# Patient Record
Sex: Male | Born: 1950 | Race: White | Hispanic: No | Marital: Married | Smoking: Former smoker
Health system: Southern US, Community
[De-identification: ages and names within clinical notes are randomized; demographics above are authoritative.]

## PROBLEM LIST (undated history)

## (undated) DIAGNOSIS — E119 Type 2 diabetes mellitus without complications: Secondary | ICD-10-CM

---

## 2006-03-18 ENCOUNTER — Ambulatory Visit: Payer: Self-pay | Admitting: General Surgery

## 2006-09-30 ENCOUNTER — Encounter: Payer: Self-pay | Admitting: Specialist

## 2006-10-06 ENCOUNTER — Ambulatory Visit: Payer: Self-pay | Admitting: Specialist

## 2007-06-11 ENCOUNTER — Ambulatory Visit: Payer: Self-pay | Admitting: Unknown Physician Specialty

## 2007-06-24 ENCOUNTER — Ambulatory Visit: Payer: Self-pay | Admitting: Unknown Physician Specialty

## 2021-05-11 ENCOUNTER — Emergency Department
Admission: EM | Admit: 2021-05-11 | Discharge: 2021-05-11 | Disposition: A | Discharge status: Home / Self Care | Payer: Medicare HMO | Attending: Emergency Medicine | Admitting: Emergency Medicine

## 2021-05-11 ENCOUNTER — Encounter: Payer: Self-pay | Admitting: *Deleted

## 2021-05-11 ENCOUNTER — Other Ambulatory Visit: Payer: Self-pay

## 2021-05-11 ENCOUNTER — Emergency Department: Payer: Medicare HMO

## 2021-05-11 DIAGNOSIS — M25512 Pain in left shoulder: Secondary | ICD-10-CM | POA: Diagnosis not present

## 2021-05-11 DIAGNOSIS — S0081XA Abrasion of other part of head, initial encounter: Secondary | ICD-10-CM | POA: Insufficient documentation

## 2021-05-11 DIAGNOSIS — S4992XA Unspecified injury of left shoulder and upper arm, initial encounter: Secondary | ICD-10-CM | POA: Diagnosis present

## 2021-05-11 DIAGNOSIS — E119 Type 2 diabetes mellitus without complications: Secondary | ICD-10-CM | POA: Diagnosis not present

## 2021-05-11 DIAGNOSIS — Z87891 Personal history of nicotine dependence: Secondary | ICD-10-CM | POA: Insufficient documentation

## 2021-05-11 DIAGNOSIS — Y92410 Unspecified street and highway as the place of occurrence of the external cause: Secondary | ICD-10-CM | POA: Diagnosis not present

## 2021-05-11 DIAGNOSIS — S40812A Abrasion of left upper arm, initial encounter: Secondary | ICD-10-CM | POA: Insufficient documentation

## 2021-05-11 HISTORY — DX: Type 2 diabetes mellitus without complications: E11.9

## 2021-05-11 MED ORDER — OXYCODONE-ACETAMINOPHEN 5-325 MG PO TABS
1.0000 | ORAL_TABLET | Freq: Once | ORAL | Status: AC
  Administered 2021-05-11: 1 via ORAL
  Filled 2021-05-11: qty 1

## 2021-05-11 MED ORDER — CEPHALEXIN 500 MG PO CAPS: 500.0000 mg | ORAL_CAPSULE | Freq: Four times a day (QID) | ORAL | 0 refills | Status: DC

## 2021-05-11 MED ORDER — OXYCODONE-ACETAMINOPHEN 5-325 MG PO TABS: 1.0000 | ORAL_TABLET | Freq: Four times a day (QID) | ORAL | 0 refills | Status: AC | PRN
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
## 2021-05-11 NOTE — Discharge Instructions (Addendum)
Take Keflex four times daily for the next seven days.  Take Percocet as needed for pain.
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
## 2021-05-12 ENCOUNTER — Telehealth: Payer: Self-pay | Admitting: Emergency Medicine

## 2021-05-12 MED ORDER — CEPHALEXIN 500 MG PO CAPS: 500.0000 mg | ORAL_CAPSULE | Freq: Four times a day (QID) | ORAL | 0 refills | Status: AC

## 2021-05-12 NOTE — Telephone Encounter (Cosign Needed)
Patient unable to pick up Keflex at prior pharmacy do to holiday. Changed pharmacy to [facility] in [place]. Patient also concerned about dressing changes for LUE wounds. Patient education was given regarding how to do dressing changes at home. Told patient he was welcome to return to ED for dressing changes if needed.

## 2021-05-15 ENCOUNTER — Encounter: Payer: Medicare HMO | Attending: Internal Medicine | Admitting: Internal Medicine

## 2021-05-15 ENCOUNTER — Other Ambulatory Visit: Payer: Self-pay

## 2021-05-15 DIAGNOSIS — E1151 Type 2 diabetes mellitus with diabetic peripheral angiopathy without gangrene: Secondary | ICD-10-CM | POA: Insufficient documentation

## 2021-05-15 DIAGNOSIS — S40812A Abrasion of left upper arm, initial encounter: Secondary | ICD-10-CM | POA: Diagnosis not present

## 2021-05-15 DIAGNOSIS — E11622 Type 2 diabetes mellitus with other skin ulcer: Secondary | ICD-10-CM | POA: Diagnosis not present
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
## 2021-05-16 NOTE — Progress Notes (Signed)
MEHAR, KIRKWOOD ([id number]) Visit Report for 05/15/2021 Abuse/Suicide Risk Screen Details Patient Name: Gregory Nielsen Date of Service: 05/15/2021 8:30 AM Medical Record Number: [id number] Patient Account Number: [id number] Date of Birth/Sex: 08/10/1951 (69 y.o. M) Treating RN: Yevonne Pax Primary Care Jameon Deller: Lenon Oms Other Clinician: Referring Karry Barrilleaux: Pia Mau Treating Janila Arrazola/Extender: Altamese [place] in Treatment: 0 Abuse/Suicide Risk Screen Items Answer ABUSE RISK SCREEN: Has anyone close to you tried to hurt or harm you recentlyo No Do you feel uncomfortable with anyone in your familyo No Has anyone forced you do things that you didnot want to doo No Electronic Signature(s) Signed: 05/16/2021 4:50:12 PM By: Yevonne Pax RN Entered By: Yevonne Pax on 05/15/2021 08:55:49 Gregory Nielsen ([id number]) -------------------------------------------------------------------------------- Activities of Daily Living Details Patient Name: Gregory Nielsen Date of Service: 05/15/2021 8:30 AM Medical Record Number: [id number] Patient Account Number: [id number] Date of Birth/Sex: 08/18/1951 (69 y.o. M) Treating RN: Yevonne Pax Primary Care Dakoda Laventure: Lenon Oms Other Clinician: Referring Lillionna Nabi: Pia Mau Treating Birtie Fellman/Extender: Altamese [place] in Treatment: 0 Activities of Daily Living Items Answer Activities of Daily Living (Please select one for each item) Drive Automobile Completely Able Take Medications Completely Able Use Telephone Completely Able Care for Appearance Completely Able Use Toilet Completely Able Bath / Shower Completely Able Dress Self Completely Able Feed Self Completely Able Walk Completely Able Get In / Out Bed Completely Able Housework Completely Able Prepare Meals Completely Able Handle Money Completely Able Shop for Self Completely Able Electronic Signature(s) Signed: 05/16/2021 4:50:12 PM By: Yevonne Pax RN Entered By: Yevonne Pax on 05/15/2021 08:56:35 Gregory Nielsen ([id number]) -------------------------------------------------------------------------------- Education Screening Details Patient Name: Gregory Nielsen Date of Service: 05/15/2021 8:30 AM Medical Record Number: [id number] Patient Account Number: [id number] Date of Birth/Sex: Apr 01, 1951 (69 y.o. M) Treating RN: Yevonne Pax Primary Care Hayden Mabin: Lenon Oms Other Clinician: Referring Altovise Wahler: Pia Mau Treating Nyemah Watton/Extender: Altamese [place] in Treatment: 0 Primary Learner Assessed: Patient Learning Preferences/Education Level/Primary Language Learning Preference: Explanation Highest Education Level: College or Above Preferred Language: English Cognitive Barrier Language Barrier: No Translator Needed: No Memory Deficit: No Emotional Barrier: No Cultural/Religious Beliefs Affecting Medical Care: No Physical Barrier Impaired Vision: No Impaired Hearing: No Decreased Hand dexterity: No Knowledge/Comprehension Knowledge Level: Medium Comprehension Level: High Ability to understand written instructions: High Ability to understand verbal instructions: High Motivation Anxiety Level: Anxious Cooperation: Cooperative Education Importance: Acknowledges Need Interest in Health Problems: Asks Questions Perception: Coherent Willingness to Engage in Self-Management High Activities: Readiness to Engage in Self-Management High Activities: Electronic Signature(s) Signed: 05/16/2021 4:50:12 PM By: Yevonne Pax RN Entered By: Yevonne Pax on 05/15/2021 08:57:15 Gregory Nielsen ([id number]) -------------------------------------------------------------------------------- Fall Risk Assessment Details Patient Name: Gregory Nielsen Date of Service: 05/15/2021 8:30 AM Medical Record Number: [id number] Patient Account Number: [id number] Date of Birth/Sex: 01-04-1951 (69 y.o. M) Treating RN: Yevonne Pax Primary Care Maris Bena: Lenon Oms Other  Clinician: Referring Tabatha Razzano: Pia Mau Treating Special Ranes/Extender: Altamese [place] in Treatment: 0 Fall Risk Assessment Items Have you had 2 or more falls in the last 12 monthso 0 No Have you had any fall that resulted in injury in the last 12 monthso 0 No FALLS RISK SCREEN History of falling - immediate or within 3 months 0 No Secondary diagnosis (Do you have 2 or more medical diagnoseso) 0 No Ambulatory aid None/bed rest/wheelchair/nurse 0 No Crutches/cane/walker 0 No Furniture 0 No Intravenous therapy Access/Saline/Heparin Lock 0 No Gait/Transferring Normal/ bed rest/ wheelchair 0 No Weak (short steps with or without shuffle, stooped  but able to lift head while walking, may 0 No seek support from furniture) Impaired (short steps with shuffle, may have difficulty arising from chair, head down, impaired 0 No balance) Mental Status Oriented to own ability 0 No Electronic Signature(s) Signed: 05/16/2021 4:50:12 PM By: Yevonne Pax RN Entered By: Yevonne Pax on 05/15/2021 08:57:23 Gregory Nielsen ([id number]) -------------------------------------------------------------------------------- Foot Assessment Details Patient Name: Gregory Nielsen Date of Service: 05/15/2021 8:30 AM Medical Record Number: [id number] Patient Account Number: [id number] Date of Birth/Sex: 1951-04-13 (69 y.o. M) Treating RN: Yevonne Pax Primary Care Mumin Denomme: Lenon Oms Other Clinician: Referring Daeveon Zweber: Pia Mau Treating Danyael Alipio/Extender: Altamese [place] in Treatment: 0 Foot Assessment Items Site Locations + = Sensation present, - = Sensation absent, C = Callus, U = Ulcer R = Redness, W = Warmth, M = Maceration, PU = Pre-ulcerative lesion F = Fissure, S = Swelling, D = Dryness Assessment Right: Left: Other Deformity: No No Prior Foot Ulcer: No No Prior Amputation: No No Charcot Joint: No No Ambulatory Status: Ambulatory Without Help Gait: Unsteady Electronic  Signature(s) Signed: 05/16/2021 4:50:12 PM By: Yevonne Pax RN Entered By: Yevonne Pax on 05/15/2021 08:57:47 Gregory Nielsen ([id number]) -------------------------------------------------------------------------------- Nutrition Risk Screening Details Patient Name: Gregory Nielsen Date of Service: 05/15/2021 8:30 AM Medical Record Number: [id number] Patient Account Number: [id number] Date of Birth/Sex: [birth year] (69 y.o. M) Treating RN: Yevonne Pax Primary Care Marlise Fahr: Lenon Oms Other Clinician: Referring Tristy Udovich: Pia Mau Treating Jeweldean Drohan/Extender: Altamese [place] in Treatment: 0 Height (in): 75 Weight (lbs): 250 Body Mass Index (BMI): 31.2 Nutrition Risk Screening Items Score Screening NUTRITION RISK SCREEN: I have an illness or condition that made me change the kind and/or amount of food I eat 0 No I eat fewer than two meals per day 0 No I eat few fruits and vegetables, or milk products 0 No I have three or more drinks of beer, liquor or wine almost every day 0 No I have tooth or mouth problems that make it hard for me to eat 0 No I don't always have enough money to buy the food I need 0 No I eat alone most of the time 0 No I take three or more different prescribed or over-the-counter drugs a day 0 No Without wanting to, I have lost or gained 10 pounds in the last six months 0 No I am not always physically able to shop, cook and/or feed myself 0 No Nutrition Protocols Good Risk Protocol 0 No interventions needed Moderate Risk Protocol High Risk Proctocol Risk Level: Good Risk Score: 0 Electronic Signature(s) Signed: 05/16/2021 4:50:12 PM By: Yevonne Pax RN Entered By: Yevonne Pax on 05/15/2021 08:57:36

## 2021-05-16 NOTE — Progress Notes (Signed)
TEAL, BONTRAGER ([id number]) Visit Report for 05/15/2021 Allergy List Details Patient Name: Gregory Nielsen, Gregory Nielsen Date of Service: 05/15/2021 8:30 AM Medical Record Number: [id number] Patient Account Number: [id number] Date of Birth/Sex: 18-Jun-1951 (70 y.o. M) Treating RN: Yevonne Pax Primary Care Dura Mccormack: Lenon Oms Other Clinician: Referring Humna Moorehouse: Pia Mau Treating Sharlene Mccluskey/Extender: Maxwell Caul Weeks in Treatment: 0 Allergies Active Allergies ibuprofen tetracycline Allergy Notes Electronic Signature(s) Signed: 05/16/2021 4:50:12 PM By: Yevonne Pax RN Entered By: Yevonne Pax on 05/15/2021 08:52:21 Gregory Nielsen ([id number]) -------------------------------------------------------------------------------- Arrival Information Details Patient Name: Gregory Nielsen Date of Service: 05/15/2021 8:30 AM Medical Record Number: [id number] Patient Account Number: [id number] Date of Birth/Sex: [birth year] (70 y.o. M) Treating RN: Yevonne Pax Primary Care Austine Kelsay: Lenon Oms Other Clinician: Referring Shivaun Bilello: Pia Mau Treating Jacier Gladu/Extender: Altamese [place] in Treatment: 0 Visit Information Patient Arrived: Ambulatory Arrival Time: 08:46 Accompanied By: wife Transfer Assistance: None Patient Identification Verified: Yes Secondary Verification Process Completed: Yes Patient Requires Transmission-Based Precautions: No Patient Has Alerts: No Electronic Signature(s) Signed: 05/16/2021 4:50:12 PM By: Yevonne Pax RN Entered By: Yevonne Pax on 05/15/2021 08:47:31 Asch, [name] ([id number]) -------------------------------------------------------------------------------- Clinic Level of Care Assessment Details Patient Name: Gregory Nielsen Date of Service: 05/15/2021 8:30 AM Medical Record Number: [id number] Patient Account Number: [id number] Date of Birth/Sex: 1951/09/04 (70 y.o. M) Treating RN: Rogers Blocker Primary Care Rianne Degraaf: Lenon Oms Other Clinician: Referring  Lakysha Kossman: Pia Mau Treating Sheriden Archibeque/Extender: Altamese [place] in Treatment: 0 Clinic Level of Care Assessment Items TOOL 2 Quantity Score X - Use when only an EandM is performed on the INITIAL visit 1 0 ASSESSMENTS - Nursing Assessment / Reassessment X - General Physical Exam (combine w/ comprehensive assessment (listed just below) when performed on new 1 20 pt. evals) X- 1 25 Comprehensive Assessment (HX, ROS, Risk Assessments, Wounds Hx, etc.) ASSESSMENTS - Wound and Skin Assessment / Reassessment X - Simple Wound Assessment / Reassessment - one wound 1 5 []  - 0 Complex Wound Assessment / Reassessment - multiple wounds []  - 0 Dermatologic / Skin Assessment (not related to wound area) ASSESSMENTS - Ostomy and/or Continence Assessment and Care []  - Incontinence Assessment and Management 0 []  - 0 Ostomy Care Assessment and Management (repouching, etc.) PROCESS - Coordination of Care X - Simple Patient / Family Education for ongoing care 1 15 []  - 0 Complex (extensive) Patient / Family Education for ongoing care X- 1 10 Staff obtains , Records, Test Results / Process Orders []  - 0 Staff telephones HHA, Nursing Homes / Clarify orders / etc []  - 0 Routine Transfer to another Facility (non-emergent condition) []  - 0 Routine Hospital Admission (non-emergent condition) []  - 0 New Admissions / / Ordering NPWT, Apligraf, etc. []  - 0 Emergency Hospital Admission (emergent condition) X- 1 10 Simple Discharge Coordination []  - 0 Complex (extensive) Discharge Coordination PROCESS - Special Needs []  - Pediatric / Minor Patient Management 0 []  - 0 Isolation Patient Management []  - 0 Hearing / Language / Visual special needs []  - 0 Assessment of Community assistance (transportation, D/C planning, etc.) []  - 0 Additional assistance / Altered mentation []  - 0 Support Surface(s) Assessment (bed, cushion, seat, etc.) INTERVENTIONS -  Wound Cleansing / Measurement X - Wound Imaging (photographs - any number of wounds) 1 5 []  - 0 Wound Tracing (instead of photographs) X- 1 5 Simple Wound Measurement - one wound []  - 0 Complex Wound Measurement - multiple wounds Henzler, Markice ( ) X- 1 5 Simple Wound Cleansing - one wound []  -  0 Complex Wound Cleansing - multiple wounds INTERVENTIONS - Wound Dressings []  - Small Wound Dressing one or multiple wounds 0 X- 1 15 Medium Wound Dressing one or multiple wounds []  - 0 Large Wound Dressing one or multiple wounds []  - 0 Application of Medications - injection INTERVENTIONS - Miscellaneous []  - External ear exam 0 []  - 0 Specimen Collection (cultures, biopsies, Nielsen, body fluids, etc.) []  - 0 Specimen(s) / Culture(s) sent or taken to Lab for analysis []  - 0 Patient Transfer (multiple staff / Lift / Similar devices) []  - 0 Simple Staple / Suture removal (25 or less) []  - 0 Complex Staple / Suture removal (26 or more) []  - 0 Hypo / Hyperglycemic Management (close monitor of Nielsen Glucose) []  - 0 Ankle / Brachial Index (ABI) - do not check if billed separately Has the patient been seen at the hospital within the last three years: Yes Total Score: 115 Level Of Care: New/Established - Level 3 Electronic Signature(s) Signed: 05/15/2021 4:52:16 PM By: , RN Entered By: , on 05/15/2021 09:27:41 Michiel Sites ( ) -------------------------------------------------------------------------------- Encounter Discharge Information Details Patient Name: Date of Service: 05/15/2021 8:30 AM Medical Record Number: Patient Account Number: 07/15/2021 Date of Birth/Sex: 1951/11/04 (70 y.o. M) Treating RN: Phillis Haggis Primary Care Allure Greaser: Dondra Prader Other Clinician: Referring Tabbitha Janvrin: 07/15/2021 Treating Reilly Blades/Extender: Gregory Nielsen in Treatment: 0 Encounter Discharge Information  Items Post Procedure Vitals Discharge Condition: Stable Temperature (F): 98 Ambulatory Status: Ambulatory Pulse (bpm): 68 Discharge Destination: Home Respiratory Rate (breaths/min): 18 Transportation: Private Auto Nielsen Pressure (mmHg): 151/72 Accompanied By: wife Schedule Follow-up Appointment: Yes Clinical Summary of Care: Electronic Signature(s) Signed: 05/15/2021 4:53:17 PM By: Gregory Nielsen Entered By: 07/15/2021 on 05/15/2021 09:45:40 [id number] (11/17/1951) -------------------------------------------------------------------------------- Lower Extremity Assessment Details Patient Name: 05-21-1993 Date of Service: 05/15/2021 8:30 AM Medical Record Number: Lenon Oms Patient Account Number: Pia Mau Date of Birth/Sex: 1951/05/13 (70 y.o. M) Treating RN: Lolita Cram Primary Care Ebrima Ranta: Lolita Cram Other Clinician: Referring Ariany Kesselman: 07/15/2021 Treating Caty Tessler/Extender: Gregory Nielsen in Treatment: 0 Electronic Signature(s) Signed: 05/16/2021 4:50:12 PM By: Gregory Blood RN Entered By: 07/15/2021 on 05/15/2021 08:51:01 MATIN, MATTIOLI (11/17/1951) -------------------------------------------------------------------------------- Multi Wound Chart Details Patient Name: 05-21-1993 Date of Service: 05/15/2021 8:30 AM Medical Record Number: Lenon Oms Patient Account Number: Pia Mau Date of Birth/Sex: [birth year] (70 y.o. M) Treating RN: Yevonne Pax Primary Care Yohana Bartha: Yevonne Pax Other Clinician: Referring Othello Sgroi: 07/15/2021 Treating Dymin Dingledine/Extender: Gregory Nielsen in Treatment: 0 Vital Signs Height(in): 75 Pulse(bpm): 68 Weight(lbs): 250 Nielsen Pressure(mmHg): 151/72 Body Mass Index(BMI): 31 Temperature(F): 98 Respiratory Rate(breaths/min): 18 Photos: [N/A:N/A] Wound Location: Left Elbow N/A N/A Wounding Event: Trauma N/A N/A Primary Etiology: Trauma, Other N/A N/A Comorbid History: Type II Diabetes N/A N/A Date  Acquired: 05/11/2021 N/A N/A Weeks of Treatment: 0 N/A N/A Wound Status: Open N/A N/A Measurements L x W x D (cm) 21x11.2x0.2 N/A N/A Area (cm) : 184.726 N/A N/A Volume (cm) : 36.945 N/A N/A Classification: Full Thickness Without Exposed N/A N/A Support Structures Exudate Amount: Small N/A N/A Exudate Type: Serosanguineous N/A N/A Exudate Color: red, brown N/A N/A Granulation Amount: Medium (34-66%) N/A N/A Granulation Quality: Red N/A N/A Necrotic Amount: Medium (34-66%) N/A N/A Exposed Structures: Fat Layer (Subcutaneous Tissue): N/A N/A Yes Fascia: No Tendon: No Muscle: No Joint: No Bone: No Epithelialization: None N/A N/A Debridement: Chemical/Enzymatic/Mechanical N/A N/A Pre-procedure Verification/Time 09:22 N/A N/A Out Taken: Instrument: Other(saline and  gauze) N/A N/A Bleeding: None N/A N/A Debridement Treatment Procedure was tolerated well N/A N/A Response: Post Debridement 21x11.2x0.2 N/A N/A Measurements L x W x D (cm) Post Debridement Volume: 36.945 N/A N/A (cm) Procedures Performed: Debridement N/A N/A Treatment Notes DECODA, VAN ([id number]) Electronic Signature(s) Signed: 05/15/2021 5:08:05 PM By: Baltazar Najjar MD Entered By: Baltazar Najjar on 05/15/2021 09:34:41 Gregory Nielsen ([id number]) -------------------------------------------------------------------------------- Multi-Disciplinary Care Plan Details Patient Name: Gregory Nielsen Date of Service: 05/15/2021 8:30 AM Medical Record Number: [id number] Patient Account Number: [id number] Date of Birth/Sex: 30-Aug-1951 (70 y.o. M) Treating RN: Rogers Blocker Primary Care Cayle Thunder: Lenon Oms Other Clinician: Referring Shenee Wignall: Pia Mau Treating Sharica Roedel/Extender: Altamese [place] in Treatment: 0 Active Inactive Orientation to the Wound Care Program Nursing Diagnoses: Knowledge deficit related to the wound healing center program Goals: Patient/caregiver will verbalize understanding of the  Wound Healing Center Program Date Initiated: 05/15/2021 Target Resolution Date: 05/15/2021 Goal Status: Active Interventions: Provide education on orientation to the wound center Notes: Wound/Skin Impairment Nursing Diagnoses: Impaired tissue integrity Goals: Patient/caregiver will verbalize understanding of skin care regimen Date Initiated: 05/15/2021 Target Resolution Date: 05/15/2021 Goal Status: Active Ulcer/skin breakdown will have a volume reduction of 30% by week 4 Date Initiated: 05/15/2021 Target Resolution Date: 06/14/2021 Goal Status: Active Ulcer/skin breakdown will have a volume reduction of 50% by week 8 Date Initiated: 05/15/2021 Target Resolution Date: 07/15/2021 Goal Status: Active Ulcer/skin breakdown will have a volume reduction of 80% by week 12 Date Initiated: 05/15/2021 Target Resolution Date: 08/15/2021 Goal Status: Active Interventions: Assess patient/caregiver ability to obtain necessary supplies Assess patient/caregiver ability to perform ulcer/skin care regimen upon admission and as needed Assess ulceration(s) every visit Provide education on ulcer and skin care Treatment Activities: Referred to DME Cheetara Hoge for dressing supplies : 05/15/2021 Skin care regimen initiated : 05/15/2021 Notes: Electronic Signature(s) Signed: 05/15/2021 4:52:16 PM By: Phillis Haggis, Dondra Prader RN Entered By: Phillis Haggis, Dondra Prader on 05/15/2021 09:21:18 Gregory Nielsen ([id number]) -------------------------------------------------------------------------------- Pain Assessment Details Patient Name: Gregory Nielsen Date of Service: 05/15/2021 8:30 AM Medical Record Number: [id number] Patient Account Number: [id number] Date of Birth/Sex: 22-Aug-1951 (70 y.o. M) Treating RN: Yevonne Pax Primary Care Zissel Biederman: Lenon Oms Other Clinician: Referring Rhina Kramme: Pia Mau Treating Charla Criscione/Extender: Altamese [place] in Treatment: 0 Active Problems Location of Pain Severity and Description of  Pain Patient Has Paino Yes Site Locations With Dressing Change: Yes Duration of the Pain. Constant / Intermittento Intermittent How Long Does it Lasto Hours: Minutes: 15 Rate the pain. Current Pain Level: 2 Worst Pain Level: 4 Least Pain Level: 0 Tolerable Pain Level: 5 Character of Pain Describe the Pain: Burning Pain Management and Medication Current Pain Management: Medication: Yes Cold Application: No Rest: Yes Massage: No Activity: No T.E.N.S.: No Heat Application: No Leg drop or elevation: No Is the Current Pain Management Adequate: Inadequate How does your wound impact your activities of daily livingo Sleep: No Bathing: No Appetite: No Relationship With Others: No Bladder Continence: No Emotions: No Bowel Continence: No Work: No Toileting: No Drive: No Dressing: No Hobbies: No Electronic Signature(s) Signed: 05/16/2021 4:50:12 PM By: Yevonne Pax RN Entered By: Yevonne Pax on 05/15/2021 08:48:51 Gregory Nielsen ([id number]) -------------------------------------------------------------------------------- Patient/Caregiver Education Details Patient Name: Gregory Nielsen Date of Service: 05/15/2021 8:30 AM Medical Record Number: [id number] Patient Account Number: [id number] Date of Birth/Gender: February 13, 1951 (70 y.o. M) Treating RN: Rogers Blocker Primary Care Physician: Lenon Oms Other Clinician: Referring Physician: Pia Mau Treating Physician/Extender: Altamese [place] in Treatment: 0 Education Assessment Education Provided  To: Patient Education Topics Provided Welcome To The Wound Care Center: Methods: Explain/Verbal Responses: State content correctly Wound/Skin Impairment: Methods: Explain/Verbal Responses: State content correctly Electronic Signature(s) Signed: 05/15/2021 4:52:16 PM By: Phillis HaggisSanchez Pereyda, Dondra PraderKenia RN Entered By: Phillis HaggisSanchez Pereyda, Dondra PraderKenia on 05/15/2021 09:28:07 Gregory Nielsen, Gregory Nielsen  ([id number]) -------------------------------------------------------------------------------- Wound Assessment Details Patient Name: Gregory Nielsen, Gregory Nielsen Date of Service: 05/15/2021 8:30 AM Medical Record Number: [id number] Patient Account Number: [id number] Date of Birth/Sex: 08/21/1951 (70 y.o. M) Treating RN: Yevonne PaxEpps, Carrie Primary Care Kortney Schoenfelder: Lenon OmsGauger, Sarah Other Clinician: Referring Dorsel Flinn: Pia MauWOODS, JACLYN Treating Faatimah Spielberg/Extender: Altamese CarolinaOBSON, MICHAEL G Weeks in Treatment: 0 Wound Status Wound Number: 1 Primary Etiology: Trauma, Other Wound Location: Left Elbow Wound Status: Open Wounding Event: Trauma Comorbid History: Type II Diabetes Date Acquired: 05/11/2021 Weeks Of Treatment: 0 Clustered Wound: No Photos Wound Measurements Length: (cm) 21 Width: (cm) 11.2 Depth: (cm) 0.2 Area: (cm) 184.726 Volume: (cm) 36.945 % Reduction in Area: % Reduction in Volume: Epithelialization: None Tunneling: No Undermining: No Wound Description Classification: Full Thickness Without Exposed Support Structu Exudate Amount: Small Exudate Type: Serosanguineous Exudate Color: red, brown res Foul Odor After Cleansing: No Slough/Fibrino Yes Wound Bed Granulation Amount: Medium (34-66%) Exposed Structure Granulation Quality: Red Fascia Exposed: No Necrotic Amount: Medium (34-66%) Fat Layer (Subcutaneous Tissue) Exposed: Yes Necrotic Quality: Adherent Slough Tendon Exposed: No Muscle Exposed: No Joint Exposed: No Bone Exposed: No Electronic Signature(s) Signed: 05/16/2021 4:50:12 PM By: Yevonne PaxEpps, Carrie RN Entered By: Yevonne PaxEpps, Carrie on 05/15/2021 09:11:06 Gregory Nielsen, Gregory Nielsen ([id number]) -------------------------------------------------------------------------------- Vitals Details Patient Name: Gregory Nielsen, Gregory Nielsen Date of Service: 05/15/2021 8:30 AM Medical Record Number: [id number] Patient Account Number: [id number] Date of Birth/Sex: 01/18/1951 (70 y.o. M) Treating RN: Yevonne PaxEpps, Carrie Primary Care Mckenzie Bove:  Lenon OmsGauger, Sarah Other Clinician: Referring Kayliah Tindol: Pia MauWOODS, JACLYN Treating Szymon Foiles/Extender: Altamese CarolinaOBSON, MICHAEL G Weeks in Treatment: 0 Vital Signs Time Taken: 08:48 Temperature (F): 98 Height (in): 75 Pulse (bpm): 68 Source: Stated Respiratory Rate (breaths/min): 18 Weight (lbs): 250 Nielsen Pressure (mmHg): 151/72 Source: Stated Reference Range: 80 - 120 mg / dl Body Mass Index (BMI): 31.2 Electronic Signature(s) Signed: 05/16/2021 4:50:12 PM By: Yevonne PaxEpps, Carrie RN Entered By: Yevonne PaxEpps, Carrie on 05/15/2021 08:50:15

## 2021-05-22 ENCOUNTER — Encounter: Payer: Medicare HMO | Admitting: Internal Medicine

## 2021-05-22 ENCOUNTER — Other Ambulatory Visit: Payer: Self-pay

## 2021-05-22 DIAGNOSIS — E11622 Type 2 diabetes mellitus with other skin ulcer: Secondary | ICD-10-CM | POA: Diagnosis not present

## 2021-05-22 NOTE — Progress Notes (Addendum)
Gregory, Gregory Nielsen ([id number]) Visit Report for 05/22/2021 Arrival Information Details Patient Name: Gregory Gregory Nielsen, Gregory Gregory Nielsen Date of Service: 05/22/2021 1:30 PM Medical Record Number: [id number] Patient Account Number: [id number] Date of Birth/Sex: May 07, 1951 (69 y.o. M) Treating RN: Hansel Feinstein Primary Care Bunnie Rehberg: Lenon Oms Other Clinician: Referring Wilder Amodei: Lenon Oms Treating Celeste Candelas/Extender: Altamese [place] in Treatment: 1 Visit Information History Since Last Visit Added or deleted any medications: No Patient Arrived: Ambulatory Any new allergies or adverse reactions: No Arrival Time: 13:46 Had a fall or experienced change in No Accompanied By: wife activities of daily living that may affect Transfer Assistance: None risk of falls: Patient Identification Verified: Yes Hospitalized since last visit: No Secondary Verification Process Completed: Yes Has Dressing in Place as Prescribed: Yes Patient Requires Transmission-Based Precautions: No Pain Present Now: Yes Patient Has Alerts: Yes Patient Alerts: DIABETIC Electronic Signature(s) Signed: 05/22/2021 2:11:15 PM By: Hansel Feinstein Entered By: Hansel Feinstein on 05/22/2021 13:46:36 Gregory, Dorinda Nielsen ([id number]) -------------------------------------------------------------------------------- Encounter Discharge Information Details Patient Name: Gregory Gregory Nielsen Date of Service: 05/22/2021 1:30 PM Medical Record Number: [id number] Patient Account Number: [id number] Date of Birth/Sex: 16-Feb-1951 (69 y.o. M) Treating RN: Huel Coventry Primary Care Liesel Peckenpaugh: Lenon Oms Other Clinician: Referring Jahmir Salo: Lenon Oms Treating Shauntay Brunelli/Extender: Altamese [place] in Treatment: 1 Encounter Discharge Information Items Post Procedure Vitals Discharge Condition: Stable Temperature (F): 98.4 Ambulatory Status: Ambulatory Pulse (bpm): 68 Discharge Destination: Home Respiratory Rate (breaths/min): 18 Transportation: Private Auto Gregory Nielsen  Pressure (mmHg): 113/74 Accompanied By: wife Schedule Follow-up Appointment: No Clinical Summary of Care: Electronic Signature(s) Signed: 05/22/2021 4:36:28 PM By: Elliot Gurney, BSN, RN, CWS, Kim RN, BSN Entered By: Elliot Gurney, BSN, RN, CWS, Kim on 05/22/2021 14:18:11 Gregory Gregory Nielsen ([id number]) -------------------------------------------------------------------------------- Lower Extremity Assessment Details Patient Name: Gregory Gregory Nielsen Date of Service: 05/22/2021 1:30 PM Medical Record Number: [id number] Patient Account Number: [id number] Date of Birth/Sex: February 03, 1951 (69 y.o. M) Treating RN: Hansel Feinstein Primary Care Jadda Hunsucker: Lenon Oms Other Clinician: Referring Sonya Pucci: Lenon Oms Treating Murl Golladay/Extender: Maxwell Caul [place] in Treatment: 1 Electronic Signature(s) Signed: 05/22/2021 2:11:15 PM By: Hansel Feinstein Entered By: Hansel Feinstein on 05/22/2021 13:51:48 Gregory, Dorinda Nielsen ([id number]) -------------------------------------------------------------------------------- Multi Wound Chart Details Patient Name: Gregory Gregory Nielsen Date of Service: 05/22/2021 1:30 PM Medical Record Number: [id number] Patient Account Number: [id number] Date of Birth/Sex: [birth year] (69 y.o. M) Treating RN: Huel Coventry Primary Care Nelda Luckey: Lenon Oms Other Clinician: Referring Yaiden Yang: Lenon Oms Treating Quentin Shorey/Extender: Altamese [place] in Treatment: 1 Vital Signs Height(in): 75 Pulse(bpm): 68 Weight(lbs): 250 Gregory Nielsen Pressure(mmHg): 118/74 Body Mass Index(BMI): 31 Temperature(F): 98.4 Respiratory Rate(breaths/min): 18 Photos: [N/A:N/A] Wound Location: Left, Posterior Upper Arm N/A N/A Wounding Event: Trauma N/A N/A Primary Etiology: Trauma, Other N/A N/A Comorbid History: Type II Diabetes N/A N/A Date Acquired: 05/11/2021 N/A N/A Weeks of Treatment: 1 N/A N/A Wound Status: Open N/A N/A Measurements L x W x D (cm) 13.5x8.5x0.1 N/A N/A Area (cm) : 90.124 N/A N/A Volume (cm) : 9.012 N/A N/A %  Reduction in Area: 51.20% N/A N/A % Reduction in Volume: 75.60% N/A N/A Classification: Full Thickness Without Exposed N/A N/A Support Structures Exudate Amount: Small N/A N/A Exudate Type: Serosanguineous N/A N/A Exudate Color: red, brown N/A N/A Granulation Amount: Large (67-100%) N/A N/A Granulation Quality: Red N/A N/A Necrotic Amount: Small (1-33%) N/A N/A Exposed Structures: Fat Layer (Subcutaneous Tissue): N/A N/A Yes Fascia: No Tendon: No Muscle: No Joint: No Bone: No Epithelialization: Small (1-33%) N/A N/A Debridement: Debridement - Excisional N/A N/A Pre-procedure Verification/Time 14:15 N/A N/A Out Taken: Tissue Debrided: Necrotic/Eschar,  Subcutaneous N/A N/A Level: Skin/Subcutaneous Tissue N/A N/A Debridement Area (sq cm): 0.14 N/A N/A Instrument: Curette N/A N/A Bleeding: Minimum N/A N/A Hemostasis Achieved: Pressure N/A N/A Debridement Treatment Procedure was tolerated well N/A N/A Response: Post Debridement 13.5x8.5x0.1 N/A N/A Measurements L x W x D (cm) Post Debridement Volume: 9.012 N/A N/A (cm) Gregory, Gregory Nielsen ([id number]) Procedures Performed: Debridement N/A N/A Treatment Notes Wound #1 (Upper Arm) Wound Laterality: Left, Posterior Cleanser Soap and Water Discharge Instruction: Gently cleanse wound with antibacterial soap, rinse and pat dry prior to dressing wounds Peri-Wound Care Topical Primary Dressing Xeroform 5x9-HBD (in/in) Discharge Instruction: Apply Xeroform 5x9-HBD (in/in) as directed Secondary Dressing Kerlix 4.5 x 4.1 (in/yd) Discharge Instruction: Apply Kerlix 4.5 x 4.1 (in/yd) as instructed Gauze Discharge Instruction: Apply on top of Xeroform Secured With 12M ACE Elastic Bandage With VELCRO Brand Closure, 4 (in) Discharge Instruction: Secure Kerlix 12M Medipore H Soft Cloth Surgical Tape, 2x2 (in/yd) Discharge Instruction: Secure Kerlix Compression Wrap Compression Stockings Add-Ons Electronic Signature(s) Signed: 05/22/2021  4:14:58 PM By: Baltazar Najjar MD Entered By: Baltazar Najjar on 05/22/2021 14:42:43 Gregory Gregory Nielsen ([id number]) -------------------------------------------------------------------------------- Multi-Disciplinary Care Plan Details Patient Name: Gregory Gregory Nielsen Date of Service: 05/22/2021 1:30 PM Medical Record Number: [id number] Patient Account Number: [id number] Date of Birth/Sex: 26-Aug-1951 (69 y.o. M) Treating RN: Huel Coventry Primary Care Jacobie Stamey: Lenon Oms Other Clinician: Referring Terina Mcelhinny: Lenon Oms Treating Mindy Behnken/Extender: Altamese [place] in Treatment: 1 Active Inactive Orientation to the Wound Care Program Nursing Diagnoses: Knowledge deficit related to the wound healing center program Goals: Patient/caregiver will verbalize understanding of the Wound Healing Center Program Date Initiated: 05/15/2021 Target Resolution Date: 05/15/2021 Goal Status: Active Interventions: Provide education on orientation to the wound center Notes: Wound/Skin Impairment Nursing Diagnoses: Impaired tissue integrity Goals: Patient/caregiver will verbalize understanding of skin care regimen Date Initiated: 05/15/2021 Target Resolution Date: 05/15/2021 Goal Status: Active Ulcer/skin breakdown will have a volume reduction of 30% by week 4 Date Initiated: 05/15/2021 Target Resolution Date: 06/14/2021 Goal Status: Active Ulcer/skin breakdown will have a volume reduction of 50% by week 8 Date Initiated: 05/15/2021 Target Resolution Date: 07/15/2021 Goal Status: Active Ulcer/skin breakdown will have a volume reduction of 80% by week 12 Date Initiated: 05/15/2021 Target Resolution Date: 08/15/2021 Goal Status: Active Interventions: Assess patient/caregiver ability to obtain necessary supplies Assess patient/caregiver ability to perform ulcer/skin care regimen upon admission and as needed Assess ulceration(s) every visit Provide education on ulcer and skin care Treatment Activities: Referred to DME  Marlen Mollica for dressing supplies : 05/15/2021 Skin care regimen initiated : 05/15/2021 Notes: Electronic Signature(s) Signed: 05/22/2021 4:36:28 PM By: Elliot Gurney, BSN, RN, CWS, Kim RN, BSN Entered By: Elliot Gurney, BSN, RN, CWS, Kim on 05/22/2021 14:15:03 Gregory Gregory Nielsen ([id number]) -------------------------------------------------------------------------------- Pain Assessment Details Patient Name: Gregory Gregory Nielsen Date of Service: 05/22/2021 1:30 PM Medical Record Number: [id number] Patient Account Number: [id number] Date of Birth/Sex: 20-Feb-1951 (69 y.o. M) Treating RN: Hansel Feinstein Primary Care Taleshia Luff: Lenon Oms Other Clinician: Referring Akiba Melfi: Lenon Oms Treating Gearald Stonebraker/Extender: Altamese [place] in Treatment: 1 Active Problems Location of Pain Severity and Description of Pain Patient Has Paino Yes Site Locations Pain Location: Pain in Ulcers Rate the pain. Current Pain Level: 3 Pain Management and Medication Current Pain Management: Electronic Signature(s) Signed: 05/22/2021 2:11:15 PM By: Hansel Feinstein Entered By: Hansel Feinstein on 05/22/2021 13:47:03 Plunk, Dorinda Nielsen ([id number]) -------------------------------------------------------------------------------- Patient/Caregiver Education Details Patient Name: Gregory Gregory Nielsen Date of Service: 05/22/2021 1:30 PM Medical Record Number: [id number] Patient Account Number: [id number] Date of Birth/Gender: February 11, 1951 (69 y.o. M)  Treating RN: Huel Coventry Primary Care Physician: Lenon Oms Other Clinician: Referring Physician: Lenon Oms Treating Physician/Extender: Altamese [place] in Treatment: 1 Education Assessment Education Provided To: Patient Education Topics Provided Wound Debridement: Handouts: Wound Debridement Methods: Demonstration, Explain/Verbal Responses: State content correctly Wound/Skin Impairment: Handouts: Caring for Your Ulcer Methods: Demonstration, Explain/Verbal Responses: State content  correctly Electronic Signature(s) Signed: 05/22/2021 4:36:28 PM By: Elliot Gurney, BSN, RN, CWS, Kim RN, BSN Entered By: Elliot Gurney, BSN, RN, CWS, Kim on 05/22/2021 14:17:10 Gregory Gregory Nielsen ([id number]) -------------------------------------------------------------------------------- Wound Assessment Details Patient Name: Gregory Gregory Nielsen Date of Service: 05/22/2021 1:30 PM Medical Record Number: [id number] Patient Account Number: [id number] Date of Birth/Sex: February 24, 1951 (69 y.o. M) Treating RN: Hansel Feinstein Primary Care Idabell Picking: Lenon Oms Other Clinician: Referring Eilam Sh[place]: Lenon Oms Treating Timmy Bubeck/Extender: Altamese [place] in Treatment: 1 Wound Status Wound Number: 1 Primary Etiology: Trauma, Other Wound Location: Left, Posterior Upper Arm Wound Status: Open Wounding Event: Trauma Comorbid History: Type II Diabetes Date Acquired: 05/11/2021 Weeks Of Treatment: 1 Clustered Wound: No Photos Wound Measurements Length: (cm) 13.5 Width: (cm) 8.5 Depth: (cm) 0.1 Area: (cm) 90.124 Volume: (cm) 9.012 % Reduction in Area: 51.2% % Reduction in Volume: 75.6% Epithelialization: Small (1-33%) Tunneling: No Undermining: No Wound Description Classification: Full Thickness Without Exposed Support Structu Exudate Amount: Small Exudate Type: Serosanguineous Exudate Color: red, brown res Foul Odor After Cleansing: No Slough/Fibrino Yes Wound Bed Granulation Amount: Large (67-100%) Exposed Structure Granulation Quality: Red Fascia Exposed: No Necrotic Amount: Small (1-33%) Fat Layer (Subcutaneous Tissue) Exposed: Yes Necrotic Quality: Adherent Slough Tendon Exposed: No Muscle Exposed: No Joint Exposed: No Bone Exposed: No Treatment Notes Wound #1 (Upper Arm) Wound Laterality: Left, Posterior Cleanser Soap and Water Discharge Instruction: Gently cleanse wound with antibacterial soap, rinse and pat dry prior to dressing wounds Peri-Wound Care ESTIL, VALLEE  ([id number]) Topical Primary Dressing Xeroform 5x9-HBD (in/in) Discharge Instruction: Apply Xeroform 5x9-HBD (in/in) as directed Secondary Dressing Kerlix 4.5 x 4.1 (in/yd) Discharge Instruction: Apply Kerlix 4.5 x 4.1 (in/yd) as instructed Gauze Discharge Instruction: Apply on top of Xeroform Secured With 35M ACE Elastic Bandage With VELCRO Brand Closure, 4 (in) Discharge Instruction: Secure Kerlix 35M Medipore H Soft Cloth Surgical Tape, 2x2 (in/yd) Discharge Instruction: Secure Kerlix Compression Wrap Compression Stockings Add-Ons Electronic Signature(s) Signed: 05/22/2021 2:11:15 PM By: Hansel Feinstein Entered By: Hansel Feinstein on 05/22/2021 13:50:58 Monterosso, Dorinda Nielsen ([id number]) -------------------------------------------------------------------------------- Vitals Details Patient Name: Gregory Gregory Nielsen Date of Service: 05/22/2021 1:30 PM Medical Record Number: [id number] Patient Account Number: [id number] Date of Birth/Sex: Nov 29, 1951 (69 y.o. M) Treating RN: Hansel Feinstein Primary Care Jamyron Redd: Lenon Oms Other Clinician: Referring Daveena Elmore: Lenon Oms Treating Marlene Beidler/Extender: Altamese  in Treatment: 1 Vital Signs Time Taken: 15:45 Temperature (F): 98.4 Height (in): 75 Pulse (bpm): 68 Weight (lbs): 250 Respiratory Rate (breaths/min): 18 Body Mass Index (BMI): 31.2 Gregory Nielsen Pressure (mmHg): 118/74 Reference Range: 80 - 120 mg / dl Electronic Signature(s) Signed: 05/22/2021 2:11:15 PM By: Hansel Feinstein Entered ByHansel Feinstein on 05/22/2021 13:46:54
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
## 2021-05-29 ENCOUNTER — Encounter: Payer: Medicare HMO | Admitting: Internal Medicine

## 2021-05-29 ENCOUNTER — Other Ambulatory Visit: Payer: Self-pay

## 2021-05-29 DIAGNOSIS — E11622 Type 2 diabetes mellitus with other skin ulcer: Secondary | ICD-10-CM | POA: Diagnosis not present
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
## 2021-06-03 NOTE — Progress Notes (Signed)
COLBE, VIVIANO ([id number]) Visit Report for 05/29/2021 Arrival Information Details Patient Name: Gregory Nielsen, Gregory Nielsen Date of Service: 05/29/2021 9:30 AM Medical Record Number: [id number] Patient Account Number: [id number] Date of Birth/Sex: Sep 12, 1951 (69 y.o. M) Treating RN: Hansel Feinstein Primary Care Arrielle Mcginn: Lenon Oms Other Clinician: Referring Jalynn Waddell: Lenon Oms Treating Othell Jaime/Extender: Altamese Worthington in Treatment: 2 Visit Information History Since Last Visit Added or deleted any medications: No Patient Arrived: Ambulatory Had a fall or experienced change in No Arrival Time: 09:35 activities of daily living that may affect Accompanied By: wife risk of falls: Transfer Assistance: None Hospitalized since last visit: No Patient Identification Verified: Yes Has Dressing in Place as Prescribed: Yes Secondary Verification Process Completed: Yes Pain Present Now: No Patient Requires Transmission-Based Precautions: No Patient Has Alerts: Yes Patient Alerts: DIABETIC Electronic Signature(s) Signed: 06/03/2021 11:12:12 AM By: Hansel Feinstein Entered By: Hansel Feinstein on 05/29/2021 09:40:41 Gregory Nielsen ([id number]) -------------------------------------------------------------------------------- Clinic Level of Care Assessment Details Patient Name: Gregory Nielsen Date of Service: 05/29/2021 9:30 AM Medical Record Number: [id number] Patient Account Number: [id number] Date of Birth/Sex: 1951/12/11 (69 y.o. M) Treating RN: Huel Coventry Primary Care Tremaine Fuhriman: Lenon Oms Other Clinician: Referring Jeyli Zwicker: Lenon Oms Treating Anan Dapolito/Extender: Altamese Milton in Treatment: 2 Clinic Level of Care Assessment Items TOOL 4 Quantity Score []  - Use when only an EandM is performed on FOLLOW-UP visit 0 ASSESSMENTS - Nursing Assessment / Reassessment X - Reassessment of Co-morbidities (includes updates in patient status) 1 10 X- 1 5 Reassessment of Adherence to Treatment  Plan ASSESSMENTS - Wound and Skin Assessment / Reassessment X - Simple Wound Assessment / Reassessment - one wound 1 5 []  - 0 Complex Wound Assessment / Reassessment - multiple wounds []  - 0 Dermatologic / Skin Assessment (not related to wound area) ASSESSMENTS - Focused Assessment []  - Circumferential Edema Measurements - multi extremities 0 []  - 0 Nutritional Assessment / Counseling / Intervention []  - 0 Lower Extremity Assessment (monofilament, tuning fork, pulses) []  - 0 Peripheral Arterial Disease Assessment (using hand held doppler) ASSESSMENTS - Ostomy and/or Continence Assessment and Care []  - Incontinence Assessment and Management 0 []  - 0 Ostomy Care Assessment and Management (repouching, etc.) PROCESS - Coordination of Care X - Simple Patient / Family Education for ongoing care 1 15 []  - 0 Complex (extensive) Patient / Family Education for ongoing care []  - 0 Staff obtains , Records, Test Results / Process Orders []  - 0 Staff telephones HHA, Nursing Homes / Clarify orders / etc []  - 0 Routine Transfer to another Facility (non-emergent condition) []  - 0 Routine Hospital Admission (non-emergent condition) []  - 0 New Admissions / / Ordering NPWT, Apligraf, etc. []  - 0 Emergency Hospital Admission (emergent condition) []  - 0 Simple Discharge Coordination []  - 0 Complex (extensive) Discharge Coordination PROCESS - Special Needs []  - Pediatric / Minor Patient Management 0 []  - 0 Isolation Patient Management []  - 0 Hearing / Language / Visual special needs []  - 0 Assessment of Community assistance (transportation, D/C planning, etc.) []  - 0 Additional assistance / Altered mentation []  - 0 Support Surface(s) Assessment (bed, cushion, seat, etc.) INTERVENTIONS - Wound Cleansing / Measurement Gregory Nielsen, Gregory ( ) X- 1 5 Simple Wound Cleansing - one wound []  - 0 Complex Wound Cleansing - multiple wounds X- 1 5 Wound  Imaging (photographs - any number of wounds) []  - 0 Wound Tracing (instead of photographs) X- 1 5 Simple Wound Measurement - one wound []  - 0 Complex Wound Measurement -  multiple wounds INTERVENTIONS - Wound Dressings X - Small Wound Dressing one or multiple wounds 1 10 []  - 0 Medium Wound Dressing one or multiple wounds []  - 0 Large Wound Dressing one or multiple wounds []  - 0 Application of Medications - topical []  - 0 Application of Medications - injection INTERVENTIONS - Miscellaneous []  - External ear exam 0 []  - 0 Specimen Collection (cultures, biopsies, Nielsen, body fluids, etc.) []  - 0 Specimen(s) / Culture(s) sent or taken to Lab for analysis []  - 0 Patient Transfer (multiple staff / Lift / Similar devices) []  - 0 Simple Staple / Suture removal (25 or less) []  - 0 Complex Staple / Suture removal (26 or more) []  - 0 Hypo / Hyperglycemic Management (close monitor of Nielsen Glucose) []  - 0 Ankle / Brachial Index (ABI) - do not check if billed separately X- 1 5 Vital Signs Has the patient been seen at the hospital within the last three years: Yes Total Score: 65 Level Of Care: New/Established - Level 2 Electronic Signature(s) Signed: 05/29/2021 5:10:04 PM By: , BSN, RN, CWS, Kim RN, BSN Entered By: , BSN, RN, CWS, Kim on 05/29/2021 10:08:07 ( ) -------------------------------------------------------------------------------- Encounter Discharge Information Details Patient Name: Date of Service: 05/29/2021 9:30 AM Medical Record Number: Patient Account Number: Date of Birth/Sex: [birth year] (69 y.o. M) Treating RN: 05/31/2021 Primary Care Allyse Fregeau: Elliot Gurney Other Clinician: Referring Avarose Mervine: Elliot Gurney Treating Genesys Coggeshall/Extender: 05/31/2021 in Treatment: 2 Encounter Discharge Information Items Discharge Condition: Stable Ambulatory Status: Ambulatory Discharge  Destination: Home Transportation: Private Auto Accompanied By: wife Schedule Follow-up Appointment: Yes Clinical Summary of Care: Electronic Signature(s) Signed: 05/29/2021 5:10:04 PM By: [id number], BSN, RN, CWS, Kim RN, BSN Entered By: Gregory Nielsen, BSN, RN, CWS, Kim on 05/29/2021 10:09:38 [id number] ([id number]) -------------------------------------------------------------------------------- Lower Extremity Assessment Details Patient Name: 11/17/1951 Date of Service: 05/29/2021 9:30 AM Medical Record Number: Huel Coventry Patient Account Number: Lenon Oms Date of Birth/Sex: 01-03-1951 (69 y.o. M) Treating RN: 05/31/2021 Primary Care Payson Crumby: Elliot Gurney Other Clinician: Referring Janeann Paisley: Elliot Gurney Treating Proctor Carriker/Extender: 05/31/2021 Weeks in Treatment: 2 Electronic Signature(s) Signed: 06/03/2021 11:12:12 AM By: [id number] Entered By: Gregory Nielsen on 05/29/2021 09:45:04 [id number] ([id number]) -------------------------------------------------------------------------------- Multi Wound Chart Details Patient Name: 11/17/1951 Date of Service: 05/29/2021 9:30 AM Medical Record Number: Hansel Feinstein Patient Account Number: Lenon Oms Date of Birth/Sex: Feb 13, 1951 (69 y.o. M) Treating RN: 06/05/2021 Primary Care Aneesa Romey: Hansel Feinstein Other Clinician: Referring Tarrin Lebow: Hansel Feinstein Treating Yue Glasheen/Extender: 05/31/2021 in Treatment: 2 Vital Signs Height(in): 75 Pulse(bpm): 60 Weight(lbs): 250 Nielsen Pressure(mmHg): 120/77 Body Mass Index(BMI): 31 Temperature(F): 97.7 Respiratory Rate(breaths/min): 16 Photos: [N/A:N/A] Wound Location: Left, Posterior Upper Arm N/A N/A Wounding Event: Trauma N/A N/A Primary Etiology: Trauma, Other N/A N/A Comorbid History: Type II Diabetes N/A N/A Date Acquired: 05/11/2021 N/A N/A Weeks of Treatment: 2 N/A N/A Wound Status: Open N/A N/A Measurements L x W x D (cm) 0.2x0.2x0.1 N/A N/A Area (cm) : 0.031 N/A N/A Volume  (cm) : 0.003 N/A N/A % Reduction in Area: 100.00% N/A N/A % Reduction in Volume: 100.00% N/A N/A Classification: Full Thickness Without Exposed N/A N/A Support Structures Exudate Amount: Small N/A N/A Exudate Type: Serosanguineous N/A N/A Exudate Color: red, brown N/A N/A Granulation Amount: Large (67-100%) N/A N/A Granulation Quality: Red N/A N/A Necrotic Amount: None Present (0%) N/A N/A Exposed Structures: Fat Layer (Subcutaneous Tissue): N/A N/A Yes Fascia: No Tendon: No Muscle: No Joint:  No Bone: No Epithelialization: Large (67-100%) N/A N/A Treatment Notes Wound #1 (Upper Arm) Wound Laterality: Left, Posterior Cleanser Peri-Wound Care Topical Primary Dressing Xeroform-HBD 2x2 (in/in) DONEVAN, Gregory Nielsen ([id number]) Discharge Instruction: Apply Xeroform-HBD 2x2 (in/in) as directed Secondary Dressing Secured With Conforming Stretch Gauze Bandage 2x75 (in/in) Discharge Instruction: Apply as directed Compression Wrap Compression Stockings Add-Ons Electronic Signature(s) Signed: 05/29/2021 4:10:05 PM By: Baltazar Najjar MD Entered By: Baltazar Najjar on 05/29/2021 10:33:17 Gregory Nielsen ([id number]) -------------------------------------------------------------------------------- Multi-Disciplinary Care Plan Details Patient Name: Gregory Nielsen Date of Service: 05/29/2021 9:30 AM Medical Record Number: [id number] Patient Account Number: [id number] Date of Birth/Sex: 04/03/1951 (69 y.o. M) Treating RN: Huel Coventry Primary Care Doral Digangi: Lenon Oms Other Clinician: Referring Malakye Nolden: Lenon Oms Treating Raziya Aveni/Extender: Altamese Maltby in Treatment: 2 Active Inactive Orientation to the Wound Care Program Nursing Diagnoses: Knowledge deficit related to the wound healing center program Goals: Patient/caregiver will verbalize understanding of the Wound Healing Center Program Date Initiated: 05/15/2021 Target Resolution Date: 05/15/2021 Goal Status:  Active Interventions: Provide education on orientation to the wound center Notes: Wound/Skin Impairment Nursing Diagnoses: Impaired tissue integrity Goals: Patient/caregiver will verbalize understanding of skin care regimen Date Initiated: 05/15/2021 Target Resolution Date: 05/15/2021 Goal Status: Active Ulcer/skin breakdown will have a volume reduction of 30% by week 4 Date Initiated: 05/15/2021 Target Resolution Date: 06/14/2021 Goal Status: Active Ulcer/skin breakdown will have a volume reduction of 50% by week 8 Date Initiated: 05/15/2021 Target Resolution Date: 07/15/2021 Goal Status: Active Ulcer/skin breakdown will have a volume reduction of 80% by week 12 Date Initiated: 05/15/2021 Target Resolution Date: 08/15/2021 Goal Status: Active Interventions: Assess patient/caregiver ability to obtain necessary supplies Assess patient/caregiver ability to perform ulcer/skin care regimen upon admission and as needed Assess ulceration(s) every visit Provide education on ulcer and skin care Treatment Activities: Referred to DME Ersa Delaney for dressing supplies : 05/15/2021 Skin care regimen initiated : 05/15/2021 Notes: Electronic Signature(s) Signed: 05/29/2021 5:10:04 PM By: Elliot Gurney, BSN, RN, CWS, Kim RN, BSN Entered By: Elliot Gurney, BSN, RN, CWS, Kim on 05/29/2021 10:03:10 Gregory Nielsen ([id number]) -------------------------------------------------------------------------------- Pain Assessment Details Patient Name: Gregory Nielsen Date of Service: 05/29/2021 9:30 AM Medical Record Number: [id number] Patient Account Number: [id number] Date of Birth/Sex: [birth year] (69 y.o. M) Treating RN: Hansel Feinstein Primary Care Alishba Naples: Lenon Oms Other Clinician: Referring Yohannes Waibel: Lenon Oms Treating Ibrahim Mcpheeters/Extender: Altamese [place] in Treatment: 2 Active Problems Location of Pain Severity and Description of Pain Patient Has Paino No Site Locations Rate the pain. Current Pain Level: 0 Pain  Management and Medication Current Pain Management: Electronic Signature(s) Signed: 06/03/2021 11:12:12 AM By: Hansel Feinstein Entered By: Hansel Feinstein on 05/29/2021 09:41:07 Gregory Nielsen ([id number]) -------------------------------------------------------------------------------- Patient/Caregiver Education Details Patient Name: Gregory Nielsen Date of Service: 05/29/2021 9:30 AM Medical Record Number: [id number] Patient Account Number: [id number] Date of Birth/Gender: [birth year] (69 y.o. M) Treating RN: Huel Coventry Primary Care Physician: Lenon Oms Other Clinician: Referring Physician: Lenon Oms Treating Physician/Extender: Altamese Olds in Treatment: 2 Education Assessment Education Provided To: Patient Education Topics Provided Wound/Skin Impairment: Handouts: Caring for Your Ulcer, Other: continue wound care as prescribed, call if healed before next visit. Electronic Signature(s) Signed: 05/29/2021 5:10:04 PM By: Elliot Gurney, BSN, RN, CWS, Kim RN, BSN Entered By: Elliot Gurney, BSN, RN, CWS, Kim on 05/29/2021 10:08:45 Gregory Nielsen, Gregory Nielsen ([id number]) -------------------------------------------------------------------------------- Wound Assessment Details Patient Name: Gregory Nielsen Date of Service: 05/29/2021 9:30 AM Medical Record Number: [id number] Patient Account Number: [id number] Date of Birth/Sex: 1951-09-25 (69 y.o. M) Treating RN: Hansel Feinstein Primary Care Arye Weyenberg: Bayard Males,  Sarah Other Clinician: Referring Shericka Johnstone: Lenon OmsGauger, Sarah Treating Eliya Geiman/Extender: Altamese CarolinaOBSON, MICHAEL G Weeks in Treatment: 2 Wound Status Wound Number: 1 Primary Etiology: Trauma, Other Wound Location: Left, Posterior Upper Arm Wound Status: Open Wounding Event: Trauma Comorbid History: Type II Diabetes Date Acquired: 05/11/2021 Weeks Of Treatment: 2 Clustered Wound: No Photos Wound Measurements Length: (cm) 0.2 Width: (cm) 0.2 Depth: (cm) 0.1 Area: (cm) 0.031 Volume: (cm) 0.003 % Reduction in Area:  100% % Reduction in Volume: 100% Epithelialization: Large (67-100%) Tunneling: No Undermining: No Wound Description Classification: Full Thickness Without Exposed Support Structu Exudate Amount: Small Exudate Type: Serosanguineous Exudate Color: red, brown res Foul Odor After Cleansing: No Slough/Fibrino Yes Wound Bed Granulation Amount: Large (67-100%) Exposed Structure Granulation Quality: Red Fascia Exposed: No Necrotic Amount: None Present (0%) Fat Layer (Subcutaneous Tissue) Exposed: Yes Tendon Exposed: No Muscle Exposed: No Joint Exposed: No Bone Exposed: No Treatment Notes Wound #1 (Upper Arm) Wound Laterality: Left, Posterior Cleanser Peri-Wound Care Topical Primary Dressing Gregory Nielsen, Gregory Nielsen ([id number]) Xeroform-HBD 2x2 (in/in) Discharge Instruction: Apply Xeroform-HBD 2x2 (in/in) as directed Secondary Dressing Secured With Conforming Stretch Gauze Bandage 2x75 (in/in) Discharge Instruction: Apply as directed Compression Wrap Compression Stockings Add-Ons Electronic Signature(s) Signed: 06/03/2021 11:12:12 AM By: Hansel FeinsteinBishop, Joy Entered By: Hansel FeinsteinBishop, Joy on 05/29/2021 09:44:13 Gregory Nielsen, Gregory Nielsen ([id number]) -------------------------------------------------------------------------------- Vitals Details Patient Name: Gregory Nielsen, Gregory Nielsen Date of Service: 05/29/2021 9:30 AM Medical Record Number: [id number] Patient Account Number: [id number] Date of Birth/Sex: 06/30/1951 [age] y.o. M) Treating RN: Hansel FeinsteinBishop, Joy Primary Care Claudy Abdallah: Lenon OmsGauger, Sarah Other Clinician: Referring Sereen Schaff: Lenon OmsGauger, Sarah Treating Twania Bujak/Extender: Altamese CarolinaOBSON, MICHAEL G Weeks in Treatment: 2 Vital Signs Time Taken: 09:40 Temperature (F): 97.7 Height (in): 75 Pulse (bpm): 60 Weight (lbs): 250 Respiratory Rate (breaths/min): 16 Body Mass Index (BMI): 31.2 Nielsen Pressure (mmHg): 120/77 Reference Range: 80 - 120 mg / dl Electronic Signature(s) Signed: 06/03/2021 11:12:12 AM By: Hansel FeinsteinBishop, Joy Entered ByHansel Feinstein: Bishop,  Joy on 05/29/2021 09:41:00

## 2021-06-05 ENCOUNTER — Ambulatory Visit: Payer: Medicare HMO | Admitting: Internal Medicine
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
## 2022-11-28 ENCOUNTER — Other Ambulatory Visit: Payer: Self-pay

## 2022-11-28 ENCOUNTER — Ambulatory Visit: Payer: Medicare HMO

## 2022-11-28 ENCOUNTER — Ambulatory Visit
Admission: EM | Admit: 2022-11-28 | Discharge: 2022-11-28 | Disposition: A | Discharge status: Home / Self Care | Payer: Medicare HMO | Attending: Family Medicine | Admitting: Family Medicine

## 2022-11-28 DIAGNOSIS — J189 Pneumonia, unspecified organism: Secondary | ICD-10-CM | POA: Insufficient documentation

## 2022-11-28 DIAGNOSIS — Z1152 Encounter for screening for COVID-19: Secondary | ICD-10-CM | POA: Insufficient documentation

## 2022-11-28 LAB — RESP PANEL BY RT-PCR (RSV, FLU A&B, COVID)  RVPGX2
Influenza A by PCR: NEGATIVE
Influenza B by PCR: NEGATIVE
Resp Syncytial Virus by PCR: NEGATIVE
SARS Coronavirus 2 by RT PCR: NEGATIVE

## 2022-11-28 MED ORDER — AZITHROMYCIN 250 MG PO TABS: ORAL_TABLET | ORAL | 0 refills | Status: AC

## 2022-11-28 MED ORDER — BENZONATATE 100 MG PO CAPS: 100.0000 mg | ORAL_CAPSULE | Freq: Three times a day (TID) | ORAL | 0 refills | Status: AC

## 2022-11-28 MED ORDER — AMOXICILLIN-POT CLAVULANATE 875-125 MG PO TABS: 1.0000 | ORAL_TABLET | Freq: Two times a day (BID) | ORAL | 0 refills | Status: AC

## 2022-11-28 MED ORDER — METHYLPREDNISOLONE 4 MG PO TBPK: ORAL_TABLET | ORAL | 0 refills | Status: AC

## 2022-11-28 NOTE — Discharge Instructions (Signed)
-  Take both Augmentin and Z-Pak as prescribed for pneumonia. -Can take Medrol Dosepak as prescribed and Tessalon as needed for cough. -Continue with over-the-counter Mucinex at this time. -Follow-up with primary care physician if symptoms fail to improve.

## 2022-11-28 NOTE — ED Triage Notes (Signed)
Pt is here with a headache, cough, chills for 2 weeks now, pt has OTC meds to relieve discomfort.

## 2022-12-01 NOTE — ED Provider Notes (Signed)
[facility]    CSN: [id number] Arrival date & time: 11/28/22  1427      History   Chief Complaint Chief Complaint  Patient presents with   Headache    HPI Gregory Nielsen is a 71 y.o. male who presents today for evaluation of ongoing cough, congestion and headache.  The patient states that the symptoms have been ongoing for the past 10-14 days.  He denies any fevers at home but he has been experiencing chills.  The patient denies any recent travel or known sick contacts.  He has been taking over-the-counter medication such as NyQuil and Robitussin without significant relief.  He denies any vision changes, he does report a headache, he does have a runny nose and reports a cough which is nonproductive.  The patient denies any sore throat.  He is not a smoker.  The patient is a diabetic.  Patient is not immunosuppressed.   Headache Associated symptoms: cough   Associated symptoms: no fever     Past Medical History:  Diagnosis Date   Diabetes mellitus without complication (HCC)     There are no problems to display for this patient.   History reviewed. No pertinent surgical history.     Home Medications    Prior to Admission medications   Medication Sig Start Date End Date Taking? Authorizing Provider  amoxicillin-clavulanate (AUGMENTIN) 875-125 MG tablet Take 1 tablet by mouth every 12 (twelve) hours. 11/28/22  Yes Anson Oregon, PA-C  azithromycin (ZITHROMAX Z-PAK) 250 MG tablet Take per package instructions. 11/28/22  Yes Anson Oregon, PA-C  benzonatate (TESSALON) 100 MG capsule Take 1 capsule (100 mg total) by mouth every 8 (eight) hours. 11/28/22  Yes Anson Oregon, PA-C  diclofenac (VOLTAREN) 75 MG EC tablet TAKE (1) TABLET BY MOUTH TWICE DAILY AS NEEDED PAIN 07/16/22  Yes [provider]  glucose blood (ONETOUCH ULTRA) test strip TEST BLOOD SUGAR TWICE DAILY OR AS DIRECTED. 11/25/22  Yes [provider]  methylPREDNISolone  (MEDROL DOSEPAK) 4 MG TBPK tablet Take per package instructions. 11/28/22  Yes Anson Oregon, PA-C  pioglitazone (ACTOS) 30 MG tablet TAKE (1) TABLET BY MOUTH EVERY DAY 08/25/22  Yes [provider]  rosuvastatin (CRESTOR) 5 MG tablet TAKE (1) TABLET BY MOUTH EVERY DAY 07/22/22  Yes [provider]  cyanocobalamin (VITAMIN B12) 1000 MCG tablet Take by mouth.    [provider]    Family History History reviewed. No pertinent family history.  Social History Social History   Tobacco Use   Smoking status: Former   Smokeless tobacco: Never  Substance Use Topics   Alcohol use: Never   Drug use: Never     Allergies   Tetracyclines & related and Ibuprofen   Review of Systems Review of Systems  Constitutional:  Negative for fever.  HENT:  Positive for rhinorrhea and sinus pain.   Respiratory:  Positive for cough, shortness of breath and wheezing.   Cardiovascular:  Negative for chest pain.  Neurological:  Positive for headaches.  All other systems reviewed and are negative.    Physical Exam Triage Vital Signs ED Triage Vitals [11/28/22 1604]  Enc Vitals Group     BP (!) 141/82     Pulse Rate 91     Resp 19     Temp 98.8 F (37.1 C)     Temp Source Oral     SpO2 99 %     Weight      Height  Head Circumference      Peak Flow      Pain Score 0     Pain Loc      Pain Edu?      Excl. in GC?    No data found.  Updated Vital Signs BP (!) 141/82 (BP Location: Left Arm)   Pulse 91   Temp 98.8 F (37.1 C) (Oral)   Resp 19   SpO2 99%   Visual Acuity Right Eye Distance:   Left Eye Distance:   Bilateral Distance:    Right Eye Near:   Left Eye Near:    Bilateral Near:     Physical Exam Vitals reviewed.  Constitutional:      General: He is not in acute distress.    Appearance: He is not ill-appearing.  HENT:     Right Ear: Tympanic membrane normal.     Left Ear: Tympanic membrane normal.     Nose:     Right Sinus: Frontal  sinus tenderness present.     Left Sinus: Frontal sinus tenderness present.     Comments: Moderate rhinorrhea with erythema to bilateral nasal canals Eyes:     Extraocular Movements: Extraocular movements intact.     Conjunctiva/sclera: Conjunctivae normal.  Cardiovascular:     Rate and Rhythm: Normal rate and regular rhythm.     Heart sounds: Normal heart sounds, S1 normal and S2 normal.  Pulmonary:     Effort: Pulmonary effort is normal.     Breath sounds: Examination of the right-upper field reveals wheezing. Examination of the left-upper field reveals wheezing. Examination of the right-middle field reveals wheezing. Examination of the left-middle field reveals wheezing. Examination of the right-lower field reveals wheezing. Examination of the left-lower field reveals wheezing. Wheezing present.     Comments: Moderate wheeze noted throughout lung fields. Abdominal:     General: Bowel sounds are normal.  Lymphadenopathy:     Cervical: No cervical adenopathy.  Neurological:     Mental Status: He is alert.      UC Treatments / Results  Labs (all labs ordered are listed, but only abnormal results are displayed) Labs Reviewed  RESP PANEL BY RT-PCR (RSV, FLU A&B, COVID)  [id number]    EKG   Radiology 2 views of the chest were obtained today at today's visit.  Prior to radiology report the x-ray demonstrated streaky atelectasis versus mild pneumonia in the left infrahilar lung  Procedures Procedures (including critical care time)  Medications Ordered in UC Medications - No data to display  Initial Impression / Assessment and Plan / UC Course  I have reviewed the triage vital signs and the nursing notes.  Pertinent labs & imaging results that were available during my care of the patient were reviewed by me and considered in my medical decision making (see chart for details).     1.  Treatment options were discussed today with the patient. 2.  The patient does have moderate  wheeze noted throughout lung fields with questionable pneumonia diagnosed on chest x-ray. 3.  Prescribed both Augmentin and Z-Pak for double coverage of community-acquired pneumonia. 4.  Medrol Dosepak also prescribed for the patient, he was instructed to monitor for any elevation in his blood glucose levels.  Tessalon as needed for suppression. 5.  He was instructed to follow-up with his primary care physician if he does not experience any relief of his symptoms or worsening symptoms. Final Clinical Impressions(s) / UC Diagnoses   Final diagnoses:  Community acquired pneumonia of  left lower lobe of lung     Discharge Instructions      -Take both Augmentin and Z-Pak as prescribed for pneumonia. -Can take Medrol Dosepak as prescribed and Tessalon as needed for cough. -Continue with over-the-counter Mucinex at this time. -Follow-up with primary care physician if symptoms fail to improve.    ED Prescriptions     Medication Sig Dispense Auth. Provider   methylPREDNISolone (MEDROL DOSEPAK) 4 MG TBPK tablet Take per package instructions. 21 tablet Blanchard Mane Lance, PA-C   benzonatate (TESSALON) 100 MG capsule Take 1 capsule (100 mg total) by mouth every 8 (eight) hours. 21 capsule Anson Oregon, PA-C   amoxicillin-clavulanate (AUGMENTIN) 875-125 MG tablet Take 1 tablet by mouth every 12 (twelve) hours. 14 tablet Blanchard Mane Lance, PA-C   azithromycin (ZITHROMAX Z-PAK) 250 MG tablet Take per package instructions. 6 tablet Anson Oregon, PA-C      I have reviewed the PDMP during this encounter.   Anson Oregon, PA-C 12/01/22 1724

## 2023-06-19 ENCOUNTER — Telehealth: Payer: Self-pay

## 2023-06-19 NOTE — Telephone Encounter (Signed)
The patient and his wife called in they wanted to schedule colonoscopy. I advised them that they will have to get a referral sent in. The patient stated that his doctors has already sent his referral and she his wife said that she was going to get one sent too as well. They are requesting Dr. Servando Snare.

## 2023-06-29 ENCOUNTER — Telehealth: Payer: Self-pay

## 2023-06-29 ENCOUNTER — Other Ambulatory Visit: Payer: Self-pay

## 2023-06-29 DIAGNOSIS — Z1211 Encounter for screening for malignant neoplasm of colon: Secondary | ICD-10-CM

## 2023-06-29 MED ORDER — NA SULFATE-K SULFATE-MG SULF 17.5-3.13-1.6 GM/177ML PO SOLN: 1.0000 | Freq: Once | ORAL | 0 refills | Status: AC

## 2023-06-29 NOTE — Telephone Encounter (Signed)
Gastroenterology Pre-Procedure Review  Request Date: 08/21/23 Requesting Physician: Dr. Servando Snare  PATIENT REVIEW QUESTIONS: The patient's wife responded to the following health history questions as indicated:    1. Are you having any GI issues? no 2. Do you have a personal history of Polyps? no 3. Do you have a family history of Colon Cancer or Polyps? no 4. Diabetes Mellitus? yes (Actos has been advised to stop 1 day prior to colonoscopy) 5. Joint replacements in the past 12 months?no 6. Major health problems in the past 3 months?no 7. Any artificial heart valves, MVP, or defibrillator?no    MEDICATIONS & ALLERGIES:    Patient reports the following regarding taking any anticoagulation/antiplatelet therapy:   Plavix, Coumadin, Eliquis, Xarelto, Lovenox, Pradaxa, Brilinta, or Effient? no Aspirin? no  Patient confirms/reports the following medications:  Current Outpatient Medications  Medication Sig Dispense Refill   amoxicillin-clavulanate (AUGMENTIN) 875-125 MG tablet Take 1 tablet by mouth every 12 (twelve) hours. 14 tablet 0   azithromycin (ZITHROMAX Z-PAK) 250 MG tablet Take per package instructions. 6 tablet 0   benzonatate (TESSALON) 100 MG capsule Take 1 capsule (100 mg total) by mouth every 8 (eight) hours. 21 capsule 0   cyanocobalamin (VITAMIN B12) 1000 MCG tablet Take by mouth.     diclofenac (VOLTAREN) 75 MG EC tablet TAKE (1) TABLET BY MOUTH TWICE DAILY AS NEEDED PAIN     glucose blood (ONETOUCH ULTRA) test strip TEST BLOOD SUGAR TWICE DAILY OR AS DIRECTED.     methylPREDNISolone (MEDROL DOSEPAK) 4 MG TBPK tablet Take per package instructions. 21 tablet 0   pioglitazone (ACTOS) 30 MG tablet TAKE (1) TABLET BY MOUTH EVERY DAY     rosuvastatin (CRESTOR) 5 MG tablet TAKE (1) TABLET BY MOUTH EVERY DAY     No current facility-administered medications for this visit.    Patient confirms/reports the following allergies:  Allergies  Allergen Reactions   Tetracyclines & Related  Rash   Ibuprofen Swelling    No orders of the defined types were placed in this encounter.   AUTHORIZATION INFORMATION Primary Insurance: 1D#: Group #:  Secondary Insurance: 1D#: Group #:  SCHEDULE INFORMATION: Date: 08/21/23 Time: Location: [facility]

## 2023-06-29 NOTE — Telephone Encounter (Signed)
Instructions corrected.  Did not provide stop date for Actos.  Thanks, [place], [place]

## 2023-08-10 ENCOUNTER — Encounter: Payer: Self-pay | Admitting: Gastroenterology

## 2023-08-21 ENCOUNTER — Encounter
Admission: RE | Disposition: A | Discharge status: Home / Self Care | Payer: Self-pay | Source: Home / Self Care | Attending: Gastroenterology

## 2023-08-21 ENCOUNTER — Other Ambulatory Visit: Payer: Self-pay

## 2023-08-21 ENCOUNTER — Ambulatory Visit
Admission: RE | Admit: 2023-08-21 | Discharge: 2023-08-21 | Disposition: A | Discharge status: Home / Self Care | Payer: Medicare HMO | Attending: Gastroenterology | Admitting: Gastroenterology

## 2023-08-21 ENCOUNTER — Ambulatory Visit: Payer: Medicare HMO | Admitting: Anesthesiology

## 2023-08-21 DIAGNOSIS — Z87891 Personal history of nicotine dependence: Secondary | ICD-10-CM | POA: Diagnosis not present

## 2023-08-21 DIAGNOSIS — K640 First degree hemorrhoids: Secondary | ICD-10-CM | POA: Insufficient documentation

## 2023-08-21 DIAGNOSIS — K635 Polyp of colon: Secondary | ICD-10-CM

## 2023-08-21 DIAGNOSIS — E119 Type 2 diabetes mellitus without complications: Secondary | ICD-10-CM | POA: Diagnosis not present

## 2023-08-21 DIAGNOSIS — D128 Benign neoplasm of rectum: Secondary | ICD-10-CM | POA: Diagnosis not present

## 2023-08-21 DIAGNOSIS — Z7984 Long term (current) use of oral hypoglycemic drugs: Secondary | ICD-10-CM | POA: Diagnosis not present

## 2023-08-21 DIAGNOSIS — Z1211 Encounter for screening for malignant neoplasm of colon: Secondary | ICD-10-CM | POA: Insufficient documentation

## 2023-08-21 HISTORY — PX: COLONOSCOPY WITH PROPOFOL: SHX5780

## 2023-08-21 HISTORY — PX: POLYPECTOMY: SHX5525

## 2023-08-21 LAB — GLUCOSE, CAPILLARY: Glucose-Capillary: 132 mg/dL — ABNORMAL HIGH (ref 70–99)

## 2023-08-21 SURGERY — COLONOSCOPY WITH PROPOFOL
Anesthesia: General

## 2023-08-21 MED ORDER — LIDOCAINE HCL (CARDIAC) PF 100 MG/5ML IV SOSY
PREFILLED_SYRINGE | INTRAVENOUS | Status: DC | PRN
  Administered 2023-08-21: 60 mg via INTRAVENOUS

## 2023-08-21 MED ORDER — SODIUM CHLORIDE 0.9 % IV SOLN: INTRAVENOUS | Status: DC

## 2023-08-21 MED ORDER — LACTATED RINGERS IV SOLN: INTRAVENOUS | Status: DC

## 2023-08-21 MED ORDER — PROPOFOL 10 MG/ML IV BOLUS
INTRAVENOUS | Status: DC | PRN
  Administered 2023-08-21: 40 mg via INTRAVENOUS
  Administered 2023-08-21: 90 mg via INTRAVENOUS
  Administered 2023-08-21: 30 mg via INTRAVENOUS
  Administered 2023-08-21: 40 mg via INTRAVENOUS
  Administered 2023-08-21: 30 mg via INTRAVENOUS

## 2023-08-21 MED ORDER — STERILE WATER FOR IRRIGATION IR SOLN
Status: DC | PRN
  Administered 2023-08-21: 1

## 2023-08-21 SURGICAL SUPPLY — 8 items
GOWN CVR UNV OPN BCK APRN NK (MISCELLANEOUS) ×2 IMPLANT
GOWN ISOL THUMB LOOP REG UNIV (MISCELLANEOUS) ×2
KIT PRC NS LF DISP ENDO (KITS) ×1 IMPLANT
KIT PROCEDURE OLYMPUS (KITS) ×1
MANIFOLD NEPTUNE II (INSTRUMENTS) ×1 IMPLANT
SNARE SHORT THROW 13M SML OVAL (MISCELLANEOUS) IMPLANT
TRAP ETRAP POLY (MISCELLANEOUS) IMPLANT
WATER STERILE IRR 250ML POUR (IV SOLUTION) ×1 IMPLANT

## 2023-08-21 NOTE — Transfer of Care (Signed)
Immediate Anesthesia Transfer of Care Note  Patient: Gregory Nielsen  Procedure(s) Performed: COLONOSCOPY WITH PROPOFOL POLYPECTOMY  Patient Location: PACU  Anesthesia Type: General  Level of Consciousness: awake, alert  and patient cooperative  Airway and Oxygen Therapy: Patient Spontanous Breathing and Patient connected to supplemental oxygen  Post-op Assessment: Post-op Vital signs reviewed, Patient's Cardiovascular Status Stable, Respiratory Function Stable, Patent Airway and No signs of Nausea or vomiting  Post-op Vital Signs: Reviewed and stable  Complications: No notable events documented.

## 2023-08-21 NOTE — Op Note (Signed)
[facility] Patient Name: Gregory Nielsen Procedure Date: 08/21/2023 7:15 AM MRN: [id number] Account #: [id number] Date of Birth: 09/24/1951 Admit Type: Outpatient Age: 72 Room: [facility] OR ROOM 01 Gender: Male Note Status: Finalized Instrument Name: [id number] Procedure:             Colonoscopy Indications:           Screening for colorectal malignant neoplasm Providers:             Midge Minium MD, MD Referring MD:          Caryl Asp (Referring MD) Medicines:             Propofol per Anesthesia Complications:         No immediate complications. Procedure:             Pre-Anesthesia Assessment:                        - Prior to the procedure, a History and Physical was                         performed, and patient medications and allergies were                         reviewed. The patient's tolerance of previous                         anesthesia was also reviewed. The risks and benefits                         of the procedure and the sedation options and risks                         were discussed with the patient. All questions were                         answered, and informed consent was obtained. Prior                         Anticoagulants: The patient has taken no anticoagulant                         or antiplatelet agents. ASA Grade Assessment: II - A                         patient with mild systemic disease. After reviewing                         the risks and benefits, the patient was deemed in                         satisfactory condition to undergo the procedure.                        After obtaining informed consent, the colonoscope was                         passed under direct vision. Throughout the procedure,  the patient's blood pressure, pulse, and oxygen                         saturations were monitored continuously. The was                         introduced through the anus and advanced to the the                          cecum, identified by appendiceal orifice and ileocecal                         valve. The colonoscopy was performed without                         difficulty. The patient tolerated the procedure well.                         The quality of the bowel preparation was excellent. Findings:      The perianal and digital rectal examinations were normal.      A 4 mm polyp was found in the cecum. The polyp was sessile. The polyp       was removed with a cold snare. Resection and retrieval were complete.      A 4 mm polyp was found in the rectum. The polyp was sessile. The polyp       was removed with a cold snare. Resection and retrieval were complete.      Non-bleeding internal hemorrhoids were found during retroflexion. The       hemorrhoids were Grade I (internal hemorrhoids that do not prolapse). Impression:            - One 4 mm polyp in the cecum, removed with a cold                         snare. Resected and retrieved.                        - One 4 mm polyp in the rectum, removed with a cold                         snare. Resected and retrieved.                        - Non-bleeding internal hemorrhoids. Recommendation:        - Discharge patient to home.                        - Resume previous diet.                        - Continue present medications.                        - Await pathology results.                        - If the pathology report reveals adenomatous tissue,  then repeat the colonoscopy for surveillance in 7                         years. Procedure Code(s):     --- Professional ---                        [phone], Colonoscopy, flexible; with removal of                         tumor(s), polyp(s), or other lesion(s) by snare                         technique Diagnosis Code(s):     --- Professional ---                        Z12.11, Encounter for screening for malignant neoplasm                         of colon                         D12.8, Benign neoplasm of rectum CPT copyright 2022 American Medical Association. All rights reserved. The codes documented in this report are preliminary and upon coder review may  be revised to meet current compliance requirements. Midge Minium MD, MD 08/21/2023 8:24:50 AM This report has been signed electronically. Number of Addenda: 0 Note Initiated On: 08/21/2023 7:15 AM Scope Withdrawal Time: 0 hours 12 minutes 11 seconds  Total Procedure Duration: 0 hours 17 minutes 33 seconds  Estimated Blood Loss:  Estimated blood loss: none.      [facility]

## 2023-08-21 NOTE — Anesthesia Postprocedure Evaluation (Signed)
Anesthesia Post Note  Patient: Gregory Nielsen  Procedure(s) Performed: COLONOSCOPY WITH PROPOFOL POLYPECTOMY  Patient location during evaluation: PACU Anesthesia Type: General Level of consciousness: awake and alert Pain management: pain level controlled Vital Signs Assessment: post-procedure vital signs reviewed and stable Respiratory status: spontaneous breathing, nonlabored ventilation, respiratory function stable and patient connected to nasal cannula oxygen Cardiovascular status: blood pressure returned to baseline and stable Postop Assessment: no apparent nausea or vomiting Anesthetic complications: no   No notable events documented.   Last Vitals:  Vitals:   08/21/23 0830 08/21/23 0836  BP: 119/72 124/67  Pulse: 75 65  Resp: 17 19  Temp:  36.7 C  SpO2: 95% 95%    Last Pain:  Vitals:   08/21/23 0836  TempSrc:   PainSc: 0-No pain                 Louie Boston

## 2023-08-21 NOTE — H&P (Signed)
Midge Minium, MD [facility] [address] [place], [place] [address] Phone: [phone] Fax : [phone]  Primary Care Physician:  Myrene Buddy, NP Primary Gastroenterologist:  Dr. Servando Snare  Pre-Procedure History & Physical: HPI:  Gregory Nielsen is a 72 y.o. male is here for a screening colonoscopy.   Past Medical History:  Diagnosis Date   Diabetes mellitus without complication (HCC)     History reviewed. No pertinent surgical history.  Prior to Admission medications   Medication Sig Start Date End Date Taking? Authorizing Provider  cyanocobalamin (VITAMIN B12) 1000 MCG tablet Take by mouth.   Yes [provider]  diclofenac (VOLTAREN) 75 MG EC tablet TAKE (1) TABLET BY MOUTH TWICE DAILY AS NEEDED PAIN 07/16/22  Yes [provider]  glucose blood (ONETOUCH ULTRA) test strip TEST BLOOD SUGAR TWICE DAILY OR AS DIRECTED. 11/25/22  Yes [provider]  pioglitazone (ACTOS) 30 MG tablet TAKE (1) TABLET BY MOUTH EVERY DAY 08/25/22  Yes [provider]  rosuvastatin (CRESTOR) 5 MG tablet TAKE (1) TABLET BY MOUTH EVERY DAY 07/22/22  Yes [provider]  amoxicillin-clavulanate (AUGMENTIN) 875-125 MG tablet Take 1 tablet by mouth every 12 (twelve) hours. Patient not taking: Reported on 06/29/2023 11/28/22   Anson Oregon, PA-C  azithromycin (ZITHROMAX Z-PAK) 250 MG tablet Take per package instructions. Patient not taking: Reported on 06/29/2023 11/28/22   Anson Oregon, PA-C  benzonatate (TESSALON) 100 MG capsule Take 1 capsule (100 mg total) by mouth every 8 (eight) hours. Patient not taking: Reported on 06/29/2023 11/28/22   Anson Oregon, PA-C  methylPREDNISolone (MEDROL DOSEPAK) 4 MG TBPK tablet Take per package instructions. Patient not taking: Reported on 06/29/2023 11/28/22   Anson Oregon, PA-C    Allergies as of 06/29/2023 - Review Complete 06/29/2023  Allergen Reaction Noted   Tetracyclines & related Rash  05/01/2014   Ibuprofen Swelling 05/11/2021    History reviewed. No pertinent family history.  Social History   Socioeconomic History   Marital status: Married    Spouse name: Not on file   Number of children: Not on file   Years of education: Not on file   Highest education level: Not on file  Occupational History   Not on file  Tobacco Use   Smoking status: Former   Smokeless tobacco: Never  Substance and Sexual Activity   Alcohol use: Never   Drug use: Never   Sexual activity: Not on file  Other Topics Concern   Not on file  Social History Narrative   Not on file   Social Determinants of Health   Financial Resource Strain: Patient Declined (06/19/2023)   Received [facility], [facility]   Overall Financial Resource Strain (CARDIA)    Difficulty of Paying Living Expenses: Patient declined  Food Insecurity: Patient Declined (06/19/2023)   Received from [facility], [facility]   Hunger Vital Sign    Worried About Running Out of Food in the Last Year: Patient declined    Ran Out of Food in the Last Year: Patient declined  Transportation Needs: Patient Declined (06/19/2023)   Received from [facility], [facility]   [facility] - Transportation    In the past 12 months, has lack of transportation kept you from medical appointments or from getting medications?: Patient declined    Lack of Transportation (Non-Medical): Patient declined  Physical Activity: Not on file  Stress: Not on  file  Social Connections: Not on file  Intimate Partner Violence: Not on file    Review of Systems: See HPI, otherwise negative ROS  Physical Exam: BP (!) 142/71   Pulse 72   Temp (!) 97 F (36.1 C) (Temporal)   Resp 14   Ht 6\' 4"  (1.93 m)   Wt 113.9 kg   SpO2 96%   BMI 30.55 kg/m  General:   Alert,  pleasant and cooperative in NAD Head:  Normocephalic and atraumatic. Neck:   Supple; no masses or thyromegaly. Lungs:  Clear throughout to auscultation.    Heart:  Regular rate and rhythm. Abdomen:  Soft, nontender and nondistended. Normal bowel sounds, without guarding, and without rebound.   Neurologic:  Alert and  oriented x4;  grossly normal neurologically.  Impression/Plan: Gregory Nielsen is now here to undergo a screening colonoscopy.  Risks, benefits, and alternatives regarding colonoscopy have been reviewed with the patient.  Questions have been answered.  All parties agreeable.

## 2023-08-21 NOTE — Anesthesia Preprocedure Evaluation (Signed)
Anesthesia Evaluation  Patient identified by MRN, date of birth, ID band Patient awake    Reviewed: Allergy & Precautions, NPO status , Patient's Chart, lab work & pertinent test results  History of Anesthesia Complications Negative for: history of anesthetic complications  Airway Mallampati: III  TM Distance: >3 FB Neck ROM: full    Dental   Pulmonary neg pulmonary ROS, former smoker   Pulmonary exam normal        Cardiovascular negative cardio ROS Normal cardiovascular exam     Neuro/Psych negative neurological ROS  negative psych ROS   GI/Hepatic negative GI ROS, Neg liver ROS,,,  Endo/Other  negative endocrine ROSdiabetes, Type 2, Oral Hypoglycemic Agents    Renal/GU negative Renal ROS  negative genitourinary   Musculoskeletal   Abdominal   Peds  Hematology negative hematology ROS (+)   Anesthesia Other Findings Past Medical History: No date: Diabetes mellitus without complication (HCC)  History reviewed. No pertinent surgical history.  BMI    Body Mass Index: 30.55 kg/m      Reproductive/Obstetrics negative OB ROS                             Anesthesia Physical Anesthesia Plan  ASA: 2  Anesthesia Plan: General   Post-op Pain Management: Minimal or no pain anticipated   Induction: Intravenous  PONV Risk Score and Plan: 1 and Propofol infusion and TIVA  Airway Management Planned: Natural Airway and Nasal Cannula  Additional Equipment:   Intra-op Plan:   Post-operative Plan:   Informed Consent: I have reviewed the patients History and Physical, chart, labs and discussed the procedure including the risks, benefits and alternatives for the proposed anesthesia with the patient or authorized representative who has indicated his/her understanding and acceptance.     Dental Advisory Given  Plan Discussed with: Anesthesiologist, CRNA and Surgeon  Anesthesia Plan  Comments: (Patient consented for risks of anesthesia including but not limited to:  - adverse reactions to medications - risk of airway placement if required - damage to eyes, teeth, lips or other oral mucosa - nerve damage due to positioning  - sore throat or hoarseness - Damage to heart, brain, nerves, lungs, other parts of body or loss of life  Patient voiced understanding.)       Anesthesia Quick Evaluation

## 2023-08-22 ENCOUNTER — Encounter: Payer: Self-pay | Admitting: Gastroenterology

## 2023-08-25 ENCOUNTER — Encounter: Payer: Self-pay | Admitting: Gastroenterology
